# Patient Record
Sex: Male | Born: 1990 | Race: Black or African American | Hispanic: No | Marital: Single
Health system: Midwestern US, Community
[De-identification: ages and names within clinical notes are randomized; demographics above are authoritative.]

---
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
## 2011-04-26 MED ORDER — FLUTICASONE 50 MCG/ACTUATION NASAL SPRAY, SUSP
50 mcg/actuation | Freq: Every day | NASAL | Status: DC
Start: 2011-04-26 — End: 2014-02-03

## 2011-04-26 NOTE — Progress Notes (Signed)
SUBJECTIVE    This new patient presents complaining of persistent right nostril congestion at night.  He says it is only at night.  He denies any ear pain or fevers.   The patient denies sinus pressure but he has had headaches in his forehead area at times - 2-3 times per months.  The patient denies cough.  The patient denies sore throat.  The patient has tried taking an over the counter allergy pill with some relief of drying up his mucus.  Says that he had symptom onset in the Spring.  It was worse then.  Has been using Afrin nightly.     OBJECTIVE    Blood pressure 112/70, pulse 98, temperature 99.2 ??F (37.3 ??C), temperature source Oral, resp. rate 16, height 5\' 9"  (1.753 m), weight 229 lb (103.874 kg), SpO2 100.00%.  General:  Alert, cooperative, well appearing, in no apparent distress.  Head:  Head is symmetric, normocephalic without evidence of trauma or deformity.  Eyes:   The lids are without swelling, lesions, or drainage.  The conjunctiva is clear and noninjected.  ENT:  The external auditory canals are without swelling or drainage.  The tympanic membranes are intact, clear, and non-erythematous however the left ear has evidence of clear fluid.  The maxillary and frontal sinuses are nontender to palpation.  The nasal turbinates are engorged and pale.  There is a hyperpigmented horizontal stripe across his nasal bridge.  There is mild septal deviation to the left.  The tongue and mucous membranes are pink and moist without lesions.  The dentition is generally in good health.  The pharynx is not erythematous without exudates.  Neck: There is no cervical lymphadenopathy.    CV:  The heart sounds are regular in rate and rhythm.  There is a normal S1 and S2.  There or no murmurs.    Lungs:  Inspiratory and expiratory efforts are full and unlabored.  Lung sounds are clear and equal to auscultation throughout all lung fields without wheezing, rales, or rhonchi.    ASSESSMENT / PLAN    1. Allergic rhinitis     2. Rhinitis medicamentosa          Discontinue Afrin.  Start Flonase 2 sprays each nostril daily.   The patient was instructed to follow-up if their condition worsened or persisted.    Patient (and parent or guardian if applicable) understands our medical plan.  Alternatives have been explained and offered.  Risks/benefits and significant side effects of medications have been reviewed.  Patient (and parent or guardian if applicable) encouraged to review package inserts for any medications.  All questions answered.  The patient (or parent or guardian if applicable)  is to call if condition worsens or fails to improve or if significant side effects are experienced.

## 2014-02-03 NOTE — Progress Notes (Signed)
1. Have you been to the ER, urgent care clinic since your last visit?  Hospitalized since your last visit? Yes finger laceration 2-3 months ago [employer]    2. Have you seen or consulted any other health care providers outside of the [facility] since your last visit?  Include any pap smears or colon screening. No

## 2014-02-03 NOTE — Progress Notes (Signed)
Christopher Stuart, 23 y.o.,  male    SUBJECTIVE  Occipital protuberance noted past few months, no pain, no increase in size.  Otherwise healthy, says no longer with allergic rhinitis symptoms of nasal congestion, post nasal drip.  Says Tdap received this year in ED after laceration in march.     ROS:  See HPI, all others negative        There is no problem list on file for this patient.      Current Outpatient Prescriptions   Medication Sig Dispense Refill   ??? fluticasone (FLONASE) 50 mcg/actuation nasal spray 2 Sprays by Both Nostrils route daily.  1 Bottle  11   ??? fexofenadine-pseudoephedrine (ALLEGRA-D 24) 180-240 mg per tablet Take 1 Tab by mouth daily.  30 Tab  0       No Known Allergies    Past Medical History   Diagnosis Date   ??? Hx of seasonal allergies        History     Social History   ??? Marital Status: SINGLE     Spouse Name: N/A     Number of Children: N/A   ??? Years of Education: N/A     Occupational History   ??? student      [employer]     Social History Main Topics   ??? Smoking status: Never Smoker    ??? Smokeless tobacco: Never Used   ??? Alcohol Use: Yes      Comment: rare   ??? Drug Use: No   ??? Sexual Activity: No     Other Topics Concern   ??? Not on file     Social History Narrative       Family History   Problem Relation Age of Onset   ??? Hypertension           OBJECTIVE    Physical Exam:     BP 130/78   Pulse 76   Temp(Src) 98.5 ??F (36.9 ??C) (Oral)   Resp 16   Ht 5' 8.5" (1.74 m)   Wt 191 lb 12.8 oz (87 kg)   BMI 28.74 kg/m2   SpO2 98%    General: alert, in no apparent distress or pain  Head: atraumatic. + occipital protuberance  Eyes: Lids with no discharge, no matting, conjunctivae clear and non injected, full EOMs, PERLLA  Ears: pinna non-tender, external auditory canal patent, TM intact  Mouth/throat:tonsils non enlarged, pharynx non erythematous and no lesion, nasal mucosa normal  Neck: supple, no adenopathy palpated  CVS: normal rate, regular rhythm, distinct S1 and S2  Lungs:clear to ausculation  bilaterally, no crackles, wheezing or rhonchi noted  Abdomen: normoactive bowel sounds, soft, non-tender  Extremities: no edema, no cyanosis,  Skin: warm, no lesions, rashes noted  Psych:  mood and affect normal        ASSESSMENT/PLAN  Christopher Stuart was seen today for establish care and mass.    Diagnoses and associated orders for this visit:    Skull lesion-occipital protuberance, reassured pt.     Ff-up prn    Patient understands plan of care. Patient has provided input and agrees with goals.

## 2014-02-04 NOTE — Addendum Note (Signed)
Addended byCathlyn Parsons on: 02/04/2014 12:12 PM     Modules accepted: Level of Service

## 2014-11-18 ENCOUNTER — Ambulatory Visit
Admit: 2014-11-18 | Discharge: 2014-11-18 | Payer: PRIVATE HEALTH INSURANCE | Attending: Family Medicine | Primary: Family Medicine

## 2014-11-18 DIAGNOSIS — K602 Anal fissure, unspecified: Secondary | ICD-10-CM

## 2014-11-18 MED ORDER — NITROGLYCERIN 0.4 % (W/W) RECTAL OINTMENT
0.4 % (w/w) | Freq: Two times a day (BID) | RECTAL | Status: DC
Start: 2014-11-18 — End: 2015-06-13

## 2014-11-18 MED ORDER — POLYETHYLENE GLYCOL 3350 17 GRAM (100 %) ORAL POWDER PACKET
17 gram | Freq: Every day | ORAL | Status: DC
Start: 2014-11-18 — End: 2015-06-13

## 2014-11-18 NOTE — Progress Notes (Signed)
Chief Complaint   Patient presents with   ??? Melena     had constipation on Monday-strained and then saw slight blood in stool yesterday-no pain     1. Have you been to the ER, urgent care clinic since your last visit?  Hospitalized since your last visit?No    2. Have you seen or consulted any other health care providers outside of the [facility] since your last visit?  Include any pap smears or colon screening. No

## 2014-11-18 NOTE — Addendum Note (Signed)
Addended by: Mable FillPASCUAL, Jemmie Ledgerwood P on: 11/18/2014 03:13 PM      Modules accepted: Level of Service

## 2014-11-18 NOTE — Patient Instructions (Signed)
Anal Fissure: Care Instructions  Your Care Instructions  An anal fissure is a tear in the lining of the lower rectum (anus). It can itch and cause pain. You may notice bright red blood on toilet paper after you wipe. A fissure may form if you are constipated and try to pass a large, hard stool or if you do not relax your anal muscles during a bowel movement.  Most anal fissures heal with home treatment after a few days or weeks. If you have an anal fissure that takes more time to heal, your doctor may prescribe medicine. In rare cases, surgery may be needed.  Anal fissures do not lead to colon cancer or other serious illnesses. However, if you have blood mixed in with the stool, talk to your doctor.  Follow-up care is a key part of your treatment and safety. Be sure to make and go to all appointments, and call your doctor if you are having problems. It's also a good idea to know your test results and keep a list of the medicines you take.  How can you care for yourself at home?  ?? If your doctor prescribed cream or ointment, use it exactly as prescribed. Call your doctor if you think you are having a problem with your medicine. You will get more details on the specific medicines your doctor prescribes.  ?? Sit in a few inches of warm water (sitz bath) 3 times a day and after bowel movements. The warm water helps the area heal and eases discomfort.  ?? Avoid constipation:  ?? Include fruits, vegetables, beans, and whole grains in your diet each day. These foods are high in fiber.  ?? Drink plenty of fluids, enough so that your urine is light yellow or clear like water. If you have kidney, heart, or liver disease and have to limit fluids, talk with your doctor before you increase the amount of fluids you drink.  ?? Get some exercise every day. Build up slowly to 30 to 60 minutes a day on 5 or more days of the week.  ?? Take a fiber supplement, such as Citrucel or Metamucil, every day if  needed. Read and follow all instructions on the label.  ?? Schedule time each day for a bowel movement. Having a daily routine may help. Take your time and do not strain when having a bowel movement.  ?? Support your feet with a small step stool when you sit on the toilet. This helps flex your hips and places your pelvis in a squatting position.  ?? Your doctor may recommend an over-the-counter laxative, such as Milk of Magnesia or Ex-Lax. Read and follow all instructions on the label, and do not use these medicines on a long-term basis.  ?? Do not use over-the-counter ointments or creams without talking to your doctor. Some of these preparations may not help.  ?? Use baby wipes or medicated pads, such as Preparation H or Tucks, instead of toilet paper to clean after a bowel movement. These products do not irritate the anus.  When should you call for help?  Watch closely for changes in your health, and be sure to contact your doctor if:  ?? You do not get better as expected.  ?? You have difficulty passing stools.  ?? You have any new symptoms, such as blood in your stools.   Where can you learn more?   Go to http://www.healthwise.net/[facility]  Enter F805 in the search box to learn more about "Anal Fissure:   Care Instructions."   ?? 2006-2015 Healthwise, Inc[employer]. Care instructions adapted under license by [facility] (which disclaims liability or warranty for this information). This care instruction is for use with your licensed healthcare professional. If you have questions about a medical condition or this instruction, always ask your healthcare professional. Healthwise, Incorporated disclaims any warranty or liability for your use of this information.  Content Version: 10.7.482551; Current as of: July 24, 2013

## 2014-11-18 NOTE — Progress Notes (Signed)
Christopher Stuart, 24 y.o.,  male    SUBJECTIVE  BRBPR twice this week    Reports straining with hard BM 3 days ago and noticed blood streaked stool then, which is unusual for him, usually daily regular soft BMs. No changes in diet, of activity.  BMs next few days were normal, but today, noticed again blood streaked stools.  Denies fam h/o IBD, abdominal, rectal pain, wt loss, diarrhea.     ROS:  See HPI, all others negative        There is no problem list on file for this patient.      Current Outpatient Prescriptions   Medication Sig Dispense Refill   ??? nitroglycerin (RECTIV) 0.4 % (w/w) ointment Insert  into rectum every twelve (12) hours every twelve (12) hours. For 2 weeks 30 g 0   ??? polyethylene glycol (MIRALAX) 17 gram packet Take 1 Packet by mouth daily. 30 Each 0       No Known Allergies    Past Medical History   Diagnosis Date   ??? Hx of seasonal allergies        History     Social History   ??? Marital Status: SINGLE     Spouse Name: N/A   ??? Number of Children: N/A   ??? Years of Education: N/A     Occupational History   ??? student      [employer]     Social History Main Topics   ??? Smoking status: Never Smoker    ??? Smokeless tobacco: Never Used   ??? Alcohol Use: Yes      Comment: rare   ??? Drug Use: No   ??? Sexual Activity: No     Other Topics Concern   ??? Not on file     Social History Narrative       Family History   Problem Relation Age of Onset   ??? Hypertension           OBJECTIVE    Physical Exam:     BP 138/86 mmHg   Pulse 67   Temp(Src) 98.1 ??F (36.7 ??C) (Oral)   Resp 16   Ht 5' 8.5" (1.74 m)   Wt 208 lb (94.348 kg)   BMI 31.16 kg/m2   SpO2 98%    General: alert, well-appearing,  in no apparent distress or pain  CVS: normal rate, regular rhythm, distinct S1 and S2  Lungs:clear to ausculation bilaterally, no crackles, wheezing or rhonchi noted  Abdomen: normoactive bowel sounds, soft, non-tender  Rectal exam: negative without mass, lesions or tenderness.  Anoscopy showed posterior linear tear with minimal bleeding   Extremities: no edema, no cyanosis,  Skin: warm, no lesions, rashes noted  Psych:  mood and affect normal        ASSESSMENT/PLAN  Berna SpareMarcus was seen today for melena.    Diagnoses and all orders for this visit:    Anal fissure  -     nitroglycerin (RECTIV) 0.4 % (w/w) ointment; Insert  into rectum every twelve (12) hours every twelve (12) hours. For 4 weeks  -     polyethylene glycol (MIRALAX) 17 gram packet; Take 1 Packet by mouth daily.    Hard stool        Follow-up Disposition:  Ff-up in 4 weeks or sooner prn.    Patient understands plan of care. Patient has provided input and agrees with goals.

## 2015-05-11 ENCOUNTER — Inpatient Hospital Stay: Admit: 2015-05-11 | Payer: BLUE CROSS/BLUE SHIELD | Primary: Family Medicine

## 2015-05-11 ENCOUNTER — Encounter: Attending: Family Medicine | Primary: Family Medicine

## 2015-05-11 ENCOUNTER — Ambulatory Visit
Admit: 2015-05-11 | Discharge: 2015-05-11 | Payer: PRIVATE HEALTH INSURANCE | Attending: Family Medicine | Primary: Family Medicine

## 2015-05-11 DIAGNOSIS — R8290 Unspecified abnormal findings in urine: Secondary | ICD-10-CM

## 2015-05-11 LAB — AMB POC URINALYSIS DIP STICK AUTO W/O MICRO
Bilirubin (UA POC): NEGATIVE
Ketones (UA POC): NEGATIVE
Nitrites (UA POC): NEGATIVE
Protein (UA POC): NEGATIVE mg/dL
Specific gravity (UA POC): 1.01 (ref 1.001–1.035)
Urobilinogen (UA POC): 1 (ref 0.2–1)
pH (UA POC): 5.5 (ref 4.6–8.0)

## 2015-05-11 MED ORDER — CIPROFLOXACIN 500 MG TAB
500 mg | ORAL_TABLET | Freq: Two times a day (BID) | ORAL | 0 refills | Status: DC
Start: 2015-05-11 — End: 2015-06-13

## 2015-05-11 NOTE — Progress Notes (Signed)
Christopher Stuart, 24 y.o.,  male    SUBJECTIVE  Urine odor x 1 week    C/o foul smelling urine and appeared cloudy. No dysuria, freq, urgency, fever, flank pain.  Says he is not sexually active, no penile discharge. Has not tried anything OTC.     ROS:  See HPI, all others negative        There is no problem list on file for this patient.      Current Outpatient Prescriptions   Medication Sig Dispense Refill   ??? ciprofloxacin HCl (CIPRO) 500 mg tablet Take 1 Tab by mouth two (2) times a day. 14 Tab 0   ??? nitroglycerin (RECTIV) 0.4 % (w/w) ointment Insert  into rectum every twelve (12) hours every twelve (12) hours. For 2 weeks 30 g 0   ??? polyethylene glycol (MIRALAX) 17 gram packet Take 1 Packet by mouth daily. 30 Each 0       No Known Allergies    Past Medical History   Diagnosis Date   ??? Hx of seasonal allergies        Social History     Social History   ??? Marital status: SINGLE     Spouse name: N/A   ??? Number of children: N/A   ??? Years of education: N/A     Occupational History   ??? student      [employer]     Social History Main Topics   ??? Smoking status: Never Smoker   ??? Smokeless tobacco: Never Used   ??? Alcohol use Yes      Comment: rare   ??? Drug use: No   ??? Sexual activity: No     Other Topics Concern   ??? Not on file     Social History Narrative       Family History   Problem Relation Age of Onset   ??? Hypertension Other          OBJECTIVE    Physical Exam:     Visit Vitals   ??? BP 132/80 (BP 1 Location: Left arm, BP Patient Position: Sitting)   ??? Pulse 70   ??? Temp 98.2 ??F (36.8 ??C)   ??? Resp 16   ??? Ht 5' 8.5" (1.74 m)   ??? Wt 198 lb (89.8 kg)   ??? BMI 29.67 kg/m2       General: alert, well-appearing, in no apparent distress or pain  Neck: supple, no adenopathy palpated  CVS: normal rate, regular rhythm, distinct S1 and S2  Lungs:clear to ausculation bilaterally, no crackles, wheezing or rhonchi noted  Abdomen: normoactive bowel sounds, soft, non-tender  Extremities: no edema, no cyanosis,   Skin: warm, no lesions, rashes noted  Psych:  mood and affect normal    Results for orders placed or performed in visit on 05/11/15   AMB POC URINALYSIS DIP STICK AUTO W/O MICRO   Result Value Ref Range    Color (UA POC) Yellow     Clarity (UA POC) Cloudy     Glucose (UA POC) 1+ Negative    Bilirubin (UA POC) Negative Negative    Ketones (UA POC) Negative Negative    Specific gravity (UA POC) 1.010 1.001 - 1.035    Blood (UA POC) 2+ Negative    pH (UA POC) 5.5 4.6 - 8.0    Protein (UA POC) Negative Negative mg/dL    Urobilinogen (UA POC) 1 mg/dL 0.2 - 1    Nitrites (UA POC) Negative Negative  Leukocyte esterase (UA POC) 3+ Negative         ASSESSMENT/PLAN  Shawnta was seen today for urinary odor and urinary burning.    Diagnoses and all orders for this visit:    Abnormal urine odor  -     AMB POC URINALYSIS DIP STICK AUTO W/O MICRO  -     CULTURE, URINE; Future  Start:  -     ciprofloxacin HCl (CIPRO) 500 mg tablet; Take 1 Tab by mouth two (2) times a day.  Increase fluid hydration/cranberry juice  Check for STD.       Follow-up Disposition:  Return in about 4 weeks (around 06/08/2015), or if symptoms worsen or fail to improve, for complete physical.      Patient understands plan of care. Patient has provided input and agrees with goals.

## 2015-05-11 NOTE — Progress Notes (Signed)
Patient identified with 2 identifiers (name and DOB).  Patient aware of + bacteria in urine. Patient states he feels much better

## 2015-05-11 NOTE — Progress Notes (Signed)
Chief Complaint   Patient presents with   ??? Urinary Odor     Chief Complaint   Patient presents with   ??? Urinary Odor   ??? Urinary Burning     1. Have you been to the ER, urgent care clinic since your last visit?  Hospitalized since your last visit?No    2. Have you seen or consulted any other health care providers outside of the [facility] since your last visit?  Include any pap smears or colon screening. No

## 2015-05-11 NOTE — Progress Notes (Signed)
Called patient and left message to please call office at convenience.

## 2015-05-11 NOTE — Patient Instructions (Signed)
Urinary Tract Infections in Men: Care Instructions  Your Care Instructions     A urinary tract infection, or UTI, is a general term for an infection anywhere between the kidneys and the tip of the penis. UTIs can also be a result of a prostate problem. Most cause pain or burning when you urinate.  Most UTIs are caused by bacteria and can be cured with antibiotics. It is important to complete your treatment so that the infection does not get worse.  Follow-up care is a key part of your treatment and safety. Be sure to make and go to all appointments, and call your doctor if you are having problems. It's also a good idea to know your test results and keep a list of the medicines you take.  How can you care for yourself at home?  ?? Take your antibiotics as prescribed. Do not stop taking them just because you feel better. You need to take the full course of antibiotics.  ?? Take your medicines exactly as prescribed. Your doctor may have prescribed a medicine, such as phenazopyridine (Pyridium), to help relieve pain when you urinate. This turns your urine orange. You may stop taking it when your symptoms get better. But be sure to take all of your antibiotics, which treat the infection.  ?? Drink extra water and juices such as cranberry and blueberry juices for the next day or two. This will help make the urine less concentrated and help wash out the bacteria causing the infection. (If you have kidney, heart, or liver disease and have to limit your fluids, talk with your doctor before you increase your fluid intake.)  ?? Avoid drinks that are carbonated or have caffeine. They can irritate the bladder.  ?? Urinate often. Try to empty your bladder each time.  ?? To relieve pain, take a hot bath or lay a heating pad (set on low) over your lower belly or genital area. Never go to sleep with a heating pad in place.  To help prevent UTIs  ?? Drink plenty of fluids, enough so that your urine is light yellow or  clear like water. If you have kidney, heart, or liver disease and have to limit fluids, talk with your doctor before you increase the amount of fluids you drink.  ?? Urinate when you have the urge. Do not hold your urine for a long time. Urinate before you go to sleep.  ?? Keep your penis clean.  Catheter care  If you have a drainage tube (catheter) in place, the following steps will help you care for it.  ?? Always wash your hands before and after touching your catheter.  ?? Check the area around the urethra for inflammation or signs of infection. Signs of infection include irritated, swollen, red, or tender skin, or pus around the catheter.  ?? Clean the area around the catheter with soap and water two times a day. Dry with a clean towel afterward.  ?? Do not apply powder or lotion to the skin around the catheter.  To empty the urine collection bag  ?? Wash your hands with soap and water.  ?? Without touching the drain spout, remove the spout from its sleeve at the bottom of the collection bag. Open the valve on the spout.  ?? Let the urine flow out of the bag and into the toilet or a container. Do not let the tubing or drain spout touch anything.  ?? After you empty the bag, clean the end of   the drain spout with tissue and water. Close the valve and put the drain spout back into its sleeve at the bottom of the collection bag.  ?? Wash your hands with soap and water.  When should you call for help?  Call your doctor now or seek immediate medical care if:  ?? Symptoms such as a fever, chills, nausea, or vomiting get worse or happen for the first time.  ?? You have new pain in your back just below your rib cage. This is called flank pain.  ?? There is new blood or pus in your urine.  ?? You are not able to take or keep down your antibiotics.  Watch closely for changes in your health, and be sure to contact your doctor if:  ?? You are not getting better after taking an antibiotic for 2 days.   ?? Your symptoms go away but then come back.  Where can you learn more?  Go to http://www.healthwise.net/[facility]  Enter S351 in the search box to learn more about "Urinary Tract Infections in Men: Care Instructions."  ?? 2006-2016 [employer]. Care instructions adapted under license by [employer] (which disclaims liability or warranty for this information). This care instruction is for use with your licensed healthcare professional. If you have questions about a medical condition or this instruction, always ask your healthcare professional. [employer] disclaims any warranty or liability for your use of this information.  Content Version: 10.9.538570; Current as of: July 30, 2014

## 2015-05-13 LAB — CHLAMYDIA/NEISSERIA AMPLIFICATION
Chlamydia amplification: NEGATIVE
N. gonorrhoeae amplification: NEGATIVE

## 2015-05-13 LAB — CULTURE, URINE: Culture result:: 70000

## 2015-05-13 NOTE — Progress Notes (Signed)
+   Bacteria in urine, should be covered by cipro.  Neg gc/chlamydia screen. pls notify pt.

## 2015-06-13 ENCOUNTER — Ambulatory Visit
Admit: 2015-06-13 | Discharge: 2015-06-13 | Payer: PRIVATE HEALTH INSURANCE | Attending: Family Medicine | Primary: Family Medicine

## 2015-06-13 DIAGNOSIS — Z0000 Encounter for general adult medical examination without abnormal findings: Secondary | ICD-10-CM

## 2015-06-13 NOTE — Progress Notes (Signed)
Chief Complaint   Patient presents with   ??? Well Male     1. Have you been to the ER, urgent care clinic since your last visit?  Hospitalized since your last visit?No    2. Have you seen or consulted any other health care providers outside of the  [facility] since your last visit?  Include any pap smears or colon screening. No

## 2015-06-13 NOTE — Patient Instructions (Signed)
Heart-Healthy Diet: Care Instructions  Your Care Instructions     A heart-healthy diet has lots of vegetables, fruits, nuts, beans, and whole grains, and is low in salt. It limits foods that are high in saturated fat, such as meats, cheeses, and fried foods. It may be hard to change your diet, but even small changes can lower your risk of heart attack and heart disease.  Follow-up care is a key part of your treatment and safety. Be sure to make and go to all appointments, and call your doctor if you are having problems. It's also a good idea to know your test results and keep a list of the medicines you take.  How can you care for yourself at home?  Watch your portions  ?? Learn what a serving is. A "serving" and a "portion" are not always the same thing. Make sure that you are not eating larger portions than are recommended. For example, a serving of pasta is ?? cup. A serving size of meat is 2 to 3 ounces. A 3-ounce serving is about the size of a deck of cards. Measure serving sizes until you are good at "eyeballing" them. Keep in mind that restaurants often serve portions that are 2 or 3 times the size of one serving.  ?? To keep your energy level up and keep you from feeling hungry, eat often but in smaller portions.  ?? Eat only the number of calories you need to stay at a healthy weight. If you need to lose weight, eat fewer calories than your body burns (through exercise and other physical activity).  Eat more fruits and vegetables  ?? Eat a variety of fruit and vegetables every day. Dark green, deep orange, red, or yellow fruits and vegetables are especially good for you. Examples include spinach, carrots, peaches, and berries.  ?? Keep carrots, celery, and other veggies handy for snacks. Buy fruit that is in season and store it where you can see it so that you will be tempted to eat it.  ?? Cook dishes that have a lot of veggies in them, such as stir-fries and soups.  Limit saturated and trans fat   ?? Read food labels, and try to avoid saturated and trans fats. They increase your risk of heart disease. Trans fat is found in many processed foods such as cookies and crackers.  ?? Use olive or canola oil when you cook. Try cholesterol-lowering spreads, such as Benecol or Take Control.  ?? Bake, broil, grill, or steam foods instead of frying them.  ?? Choose lean meats instead of high-fat meats such as hot dogs and sausages. Cut off all visible fat when you prepare meat.  ?? Eat fish, skinless poultry, and meat alternatives such as soy products instead of high-fat meats. Soy products, such as tofu, may be especially good for your heart.  ?? Choose low-fat or fat-free milk and dairy products.  Eat fish  ?? Eat at least two servings of fish a week. Certain fish, such as salmon and tuna, contain omega-3 fatty acids, which may help reduce your risk of heart attack.  Eat foods high in fiber  ?? Eat a variety of grain products every day. Include whole-grain foods that have lots of fiber and nutrients. Examples of whole-grain foods include oats, whole wheat bread, and brown rice.  ?? Buy whole-grain breads and cereals, instead of white bread or pastries.  Limit salt and sodium  ?? Limit how much salt and sodium you eat to   help lower your blood pressure.  ?? Taste food before you salt it. Add only a little salt when you think you need it. With time, your taste buds will adjust to less salt.  ?? Eat fewer snack items, fast foods, and other high-salt, processed foods. Check food labels for the amount of sodium in packaged foods.  ?? Choose low-sodium versions of canned goods (such as soups, vegetables, and beans).  Limit sugar  ?? Limit drinks and foods with added sugar. These include candy, desserts, and soda pop.  Limit alcohol  ?? Limit alcohol to no more than 2 drinks a day for men and 1 drink a day for women. Too much alcohol can cause health problems.  When should you call for help?   Watch closely for changes in your health, and be sure to contact your doctor if:  ?? You would like help planning heart-healthy meals.  Where can you learn more?  Go to http://www.healthwise.net/[facility]Help[employer]ctions  Enter V137 in the search box to learn more about "Heart-Healthy Diet: Care Instructions."  ?? 2006-2016 [employer]. Care instructions adapted under license by [employer] (which disclaims liability or warranty for this information). This care instruction is for use with your licensed healthcare professional. If you have questions about a medical condition or this instruction, always ask your healthcare professional. [employer] disclaims any warranty or liability for your use of this information.  Content Version: 11.0.578772; Current as of: October 06, 2014

## 2015-06-13 NOTE — Progress Notes (Signed)
Subjective:   Christopher Stuart is a 24 y.o. male presenting for his annual checkup.    ROS:  Feeling well. No dyspnea or chest pain on exertion. No abdominal pain, change in bowel habits, black or bloody stools. No urinary tract or prostatic symptoms. No neurological complaints.    Specific concerns today: none, says dysuria resolved. No longer with constipation. Exercises daily. Mom has h/o dm and HL.       No Known Allergies  Past Medical History   Diagnosis Date   ??? Hx of seasonal allergies      History reviewed. No pertinent past surgical history.  Family History   Problem Relation Age of Onset   ??? Hypertension Other      Social History   Substance Use Topics   ??? Smoking status: Never Smoker   ??? Smokeless tobacco: Never Used   ??? Alcohol use Yes      Comment: rare          Objective:     Visit Vitals   ??? BP 132/88 (BP 1 Location: Left arm, BP Patient Position: Sitting)   ??? Pulse 72   ??? Temp 98.3 ??F (36.8 ??C) (Oral)   ??? Resp 16   ??? Ht 5' 8.5" (1.74 m)   ??? Wt 195 lb (88.5 kg)   ??? SpO2 98%   ??? BMI 29.22 kg/m2     The patient appears well, alert, oriented x 3, in no distress.   ENT normal.  Neck supple. No adenopathy or thyromegaly. PERLA. Lungs are clear, good air entry, no wheezes, rhonchi or rales. S1 and S2 normal, no murmurs, regular rate and rhythm. Abdomen is soft without tenderness, guarding, mass or organomegaly.   Extremities show no edema, normal peripheral pulses. Neurological is normal without focal findings.    Assessment/Plan:   [name] was seen today for well male.    Diagnoses and all orders for this visit:    Well adult exam  healthy adult male  lose weight, increase physical activity, routine labs ordered, call if any problems.  reviewed diet, exercise and weight control.  Offered HPV vaccine, pt will inquire with insurance if covered first and will sched a nurse visit.     Family history of elevated blood lipids  -     LIPID PANEL; Future  Family history of diabetes mellitus (DM)   -     METABOLIC PANEL, BASIC; Future    Ff-up in 1 year or sooner prn    Patient/guardian understands plan of care. Patient has provided input and agrees with goals. Future labs to be discussed on next visit.

## 2015-07-23 ENCOUNTER — Inpatient Hospital Stay: Admit: 2015-07-23 | Payer: BLUE CROSS/BLUE SHIELD | Primary: Family Medicine

## 2015-07-23 DIAGNOSIS — Z833 Family history of diabetes mellitus: Secondary | ICD-10-CM

## 2015-07-23 LAB — METABOLIC PANEL, BASIC
Anion gap: 10 mmol/L (ref 3.0–18)
BUN/Creatinine ratio: 13 (ref 12–20)
BUN: 11 MG/DL (ref 7.0–18)
CO2: 26 mmol/L (ref 21–32)
Calcium: 9.7 MG/DL (ref 8.5–10.1)
Chloride: 104 mmol/L (ref 100–108)
Creatinine: 0.88 MG/DL (ref 0.6–1.3)
GFR est AA: >60 mL/min/{1.73_m2} (ref >60)
GFR est non-AA: >60 mL/min/{1.73_m2} (ref >60)
Glucose: 81 mg/dL (ref 74–99)
Potassium: 3.9 mmol/L (ref 3.5–5.5)
Sodium: 140 mmol/L (ref 136–145)

## 2015-07-23 LAB — LIPID PANEL
CHOL/HDL Ratio: 3.2 (ref 0–5.0)
Cholesterol, total: 158 MG/DL (ref <200)
HDL Cholesterol: 50 MG/DL (ref 40–60)
LDL, calculated: 97.8 MG/DL (ref 0–100)
Triglyceride: 51 MG/DL (ref <150)
VLDL, calculated: 10.2 MG/DL

## 2015-07-25 NOTE — Progress Notes (Signed)
Cholesterol and DM screen are normal. pls notify pt.

## 2015-07-26 NOTE — Progress Notes (Signed)
Called patient and left message to please call office regarding results.

## 2015-07-26 NOTE — Progress Notes (Signed)
Patient identified with 2 identifiers (name and DOB).  Patient aware of normal labs

## 2023-07-04 IMAGING — MR MRI CSPINE WO CONTRAST
6 series · 48 of 48 positions shown · non-contrast
Comparison: None.

Images Obtained from [facility]
HISTORY: 32 years-old Male with Cervicalgia, Dizziness and giddiness. Neck pain, pain going down to the left arm to little finger.
TECHNIQUE: MRI study of the cervical spine was performed with the sagittal and axial images in varying sequences.

[Series 1: bSSFP · axial · 10.0mm · 1.17mm/px · z∈[-60,+153]mm · 5 of 14 slices shown]
[im 1/14]
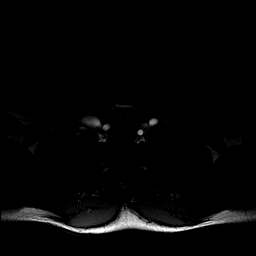
[im 4/14]
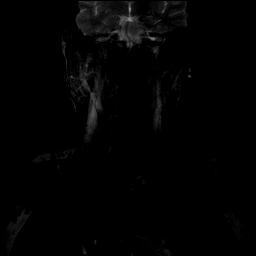
[im 7/14]
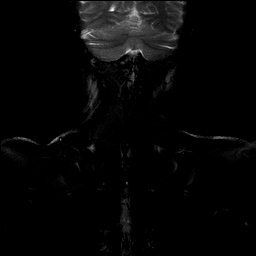
[im 10/14]
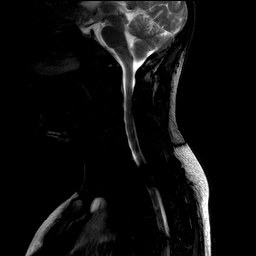
[im 14/14]
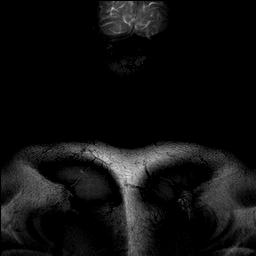

[Series 2: t2_sag · sagittal · 3.0mm · 0.69mm/px · 7 of 17 slices shown]
[im 1/17]
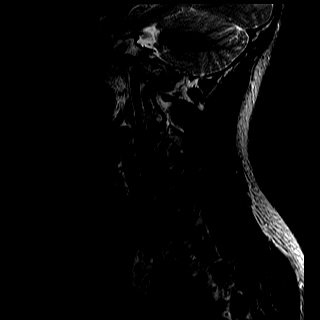
[im 3/17]
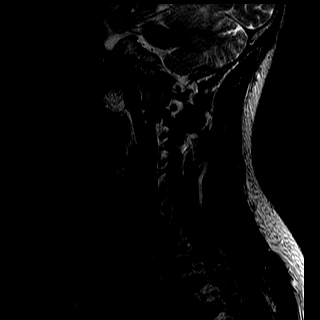
[im 6/17]
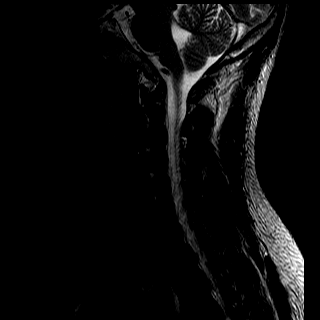
[im 9/17]
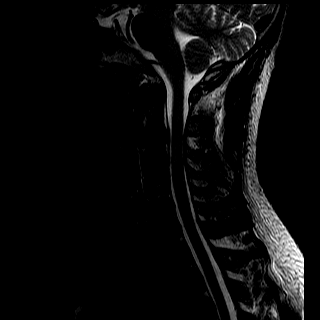
[im 11/17]
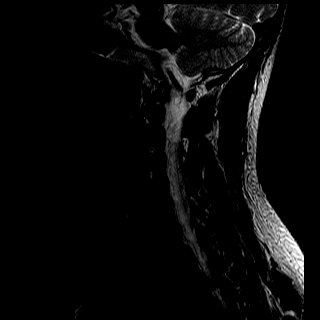
[im 14/17]
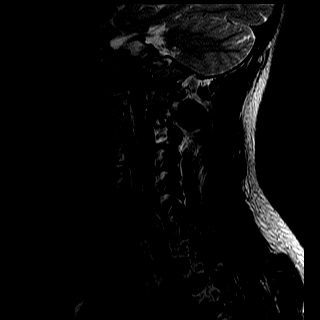
[im 17/17]
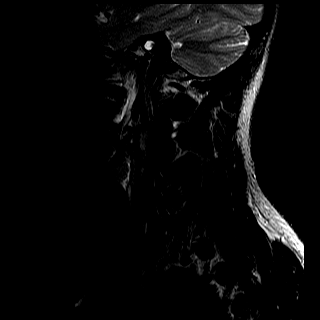

[Series 3: t1_sag · sagittal · 3.0mm · 0.86mm/px · 7 of 17 slices shown]
[im 1/17]
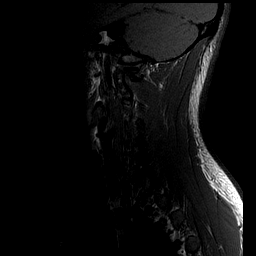
[im 3/17]
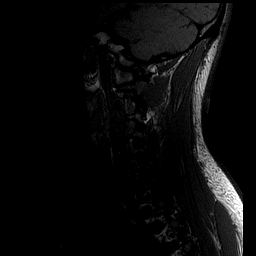
[im 6/17]
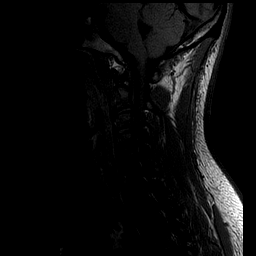
[im 9/17]
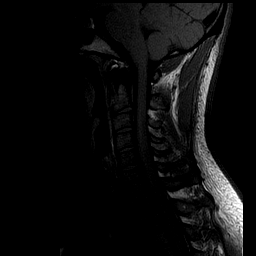
[im 11/17]
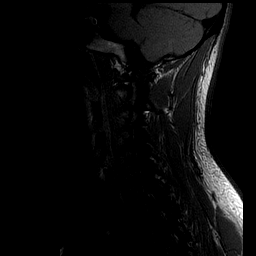
[im 14/17]
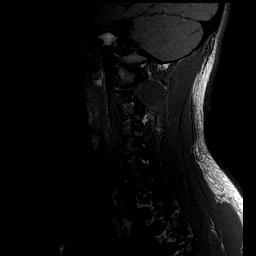
[im 17/17]
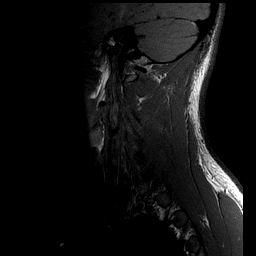

[Series 4: ir_sag · sagittal · 3.0mm · 0.86mm/px · 7 of 17 slices shown]
[im 1/17]
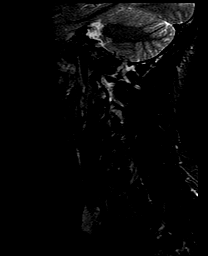
[im 3/17]
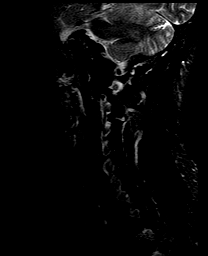
[im 6/17]
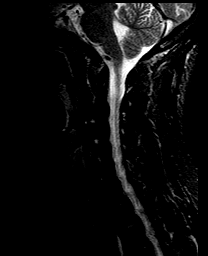
[im 9/17]
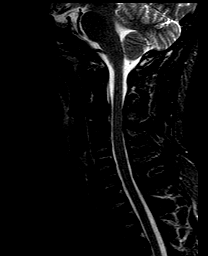
[im 11/17]
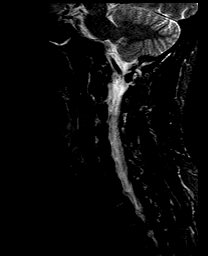
[im 14/17]
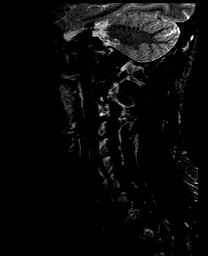
[im 17/17]
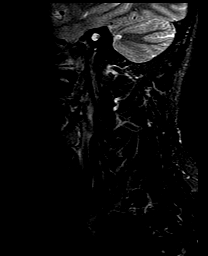

[Series 5: t2_medic_axial · axial · 3.0mm · 0.70mm/px · z∈[-75,+31]mm · 11 of 28 slices shown]
[im 1/28]
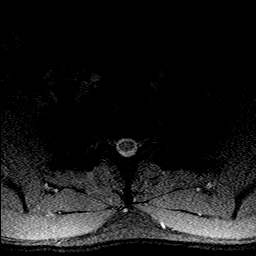
[im 3/28]
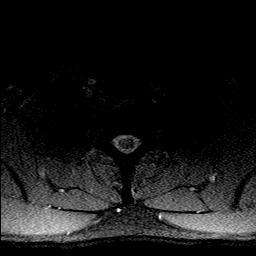
[im 6/28]
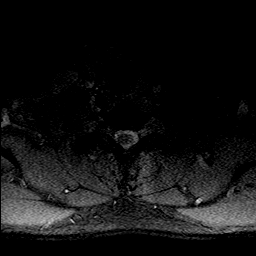
[im 9/28]
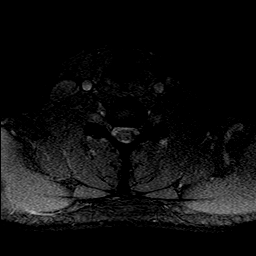
[im 11/28]
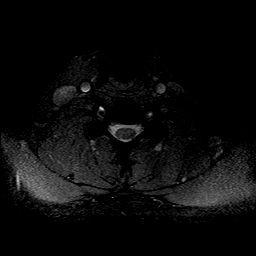
[im 14/28]
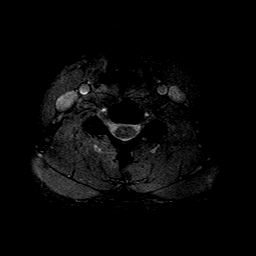
[im 17/28]
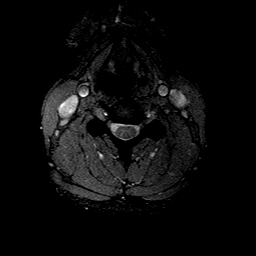
[im 19/28]
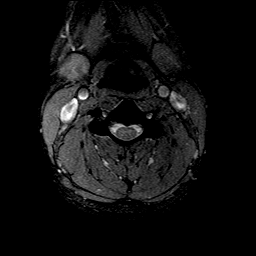
[im 22/28]
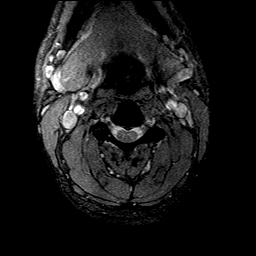
[im 25/28]
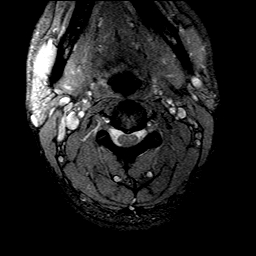
[im 28/28]
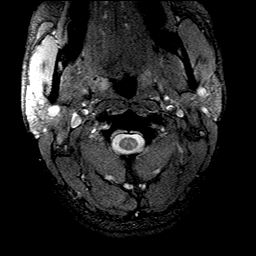

[Series 6: t2_(person_name)_(person_name) · axial · 3.0mm · 0.78mm/px · z∈[-77,+29]mm · 11 of 27 slices shown]
[im 1/27]
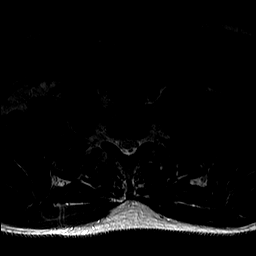
[im 3/27]
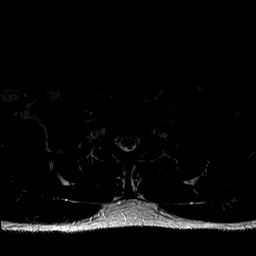
[im 6/27]
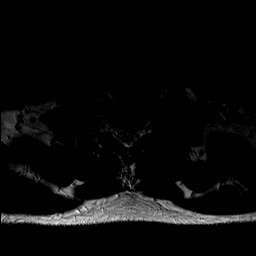
[im 8/27]
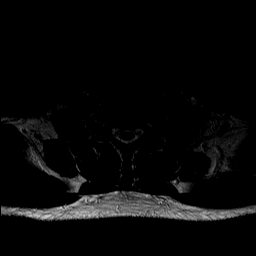
[im 11/27]
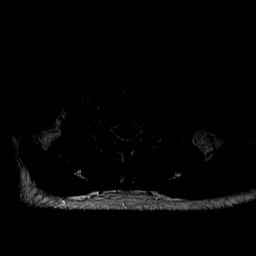
[im 14/27]
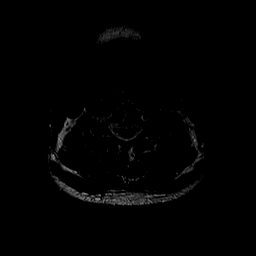
[im 16/27]
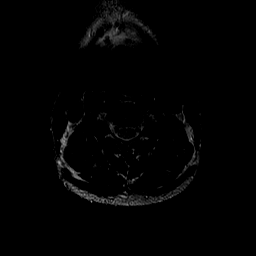
[im 19/27]
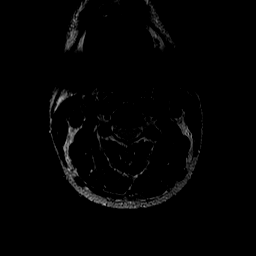
[im 21/27]
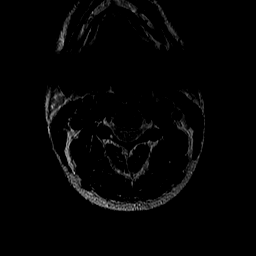
[im 24/27]
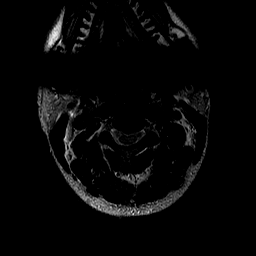
[im 27/27]
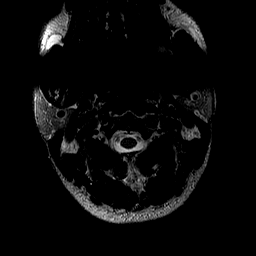

[48 of 48 positions shown; findings below may reference images not displayed]

FINDINGS: Vertebral body height and alignment: The vertebral body height and alignment are within normal limits.
Marrow signal: The marrow signal is unremarkable.
Degenerative disc changes: No significant degenerative disc changes are present.
Posterior fossa and the cervical cord: Demonstrate normal signal.
C2-3 level:  Small areas tear with a disc protrusion. Mild spinal canal stenosis. Both foramina are patent.
C3-4 level: Small annulus tear with a small disc protrusion. Mild spinal canal stenosis. Mild anterior deformity of the cervical cord without cord edema.
C4-5 level: No spinal canal stenosis. Neural foramina are patent.
C5-6 level: No spinal canal stenosis. Neural foramina are patent.
C6-7 level: No spinal canal stenosis. Neural foramina are patent.
C7-T1 level: No spinal canal stenosis. Neural foramina are patent.
IMPRESSION: 1.  Mild C2-3 and C3-4 spinal canal stenosis.
2.  Mild anterior deformity of the cervical cord identified at C3-4 level without cord edema.
